# Patient Record
Sex: Female | Born: 1970 | Race: White | Hispanic: No | Marital: Single | Smoking: Never smoker
Health system: Southern US, Community
[De-identification: ages and names within clinical notes are randomized; demographics above are authoritative.]

---

## 1999-07-31 ENCOUNTER — Other Ambulatory Visit: Admission: RE | Admit: 1999-07-31 | Discharge: 1999-07-31 | Payer: Self-pay | Admitting: Obstetrics and Gynecology

## 2000-08-07 ENCOUNTER — Other Ambulatory Visit: Admission: RE | Admit: 2000-08-07 | Discharge: 2000-08-07 | Payer: Self-pay | Admitting: Obstetrics and Gynecology

## 2001-08-10 ENCOUNTER — Other Ambulatory Visit: Admission: RE | Admit: 2001-08-10 | Discharge: 2001-08-10 | Payer: Self-pay | Admitting: Obstetrics and Gynecology

## 2018-04-17 ENCOUNTER — Encounter: Payer: Self-pay | Admitting: Emergency Medicine

## 2018-04-17 ENCOUNTER — Emergency Department (INDEPENDENT_AMBULATORY_CARE_PROVIDER_SITE_OTHER)
Admission: EM | Admit: 2018-04-17 | Discharge: 2018-04-17 | Disposition: A | Discharge status: Home / Self Care | Payer: Worker's Compensation | Source: Home / Self Care | Attending: Family Medicine | Admitting: Family Medicine

## 2018-04-17 DIAGNOSIS — S7011XA Contusion of right thigh, initial encounter: Secondary | ICD-10-CM

## 2018-04-17 NOTE — ED Provider Notes (Signed)
[facility]    CSN: [id number] Arrival date & time: 04/17/18  0919     History   Chief Complaint Chief Complaint  Patient presents with  . Injury    HPI Danielle Chan is a 47 y.o. female.   While at work four days ago, patient fell through a broken floor tile.  She fell straight down approximately 18 to 20 inches landing upright on her feet.  She struck her right anterior/lateral thigh on frame edge, resulting in immediate swelling/bruising.  She subsequently also experienced mild pain in her left knee, but was able to walk without difficulty afterwards.  Over the past four days the swelling in her right thigh has gradually decreased but she has persistent mild localized tenderness.  Her left knee now has minimal discomfort, and full range of motion.  She has been taking Aleve.  The history is provided by the patient.  Leg Pain  Location:  Knee (right anterior/lateral thigh) Time since incident:  4 days Injury: yes   Knee location:  L knee Pain details:    Quality:  Aching   Radiates to:  Does not radiate   Severity:  Moderate   Onset quality:  Sudden   Duration:  4 days   Timing:  Constant   Progression:  Improving Chronicity:  New Dislocation: no   Foreign body present:  No foreign bodies Prior injury to area:  No Relieved by:  Nothing Worsened by:  Activity Associated symptoms: swelling   Associated symptoms: no back pain, no decreased ROM, no fatigue, no fever, no muscle weakness, no numbness, no stiffness and no tingling     History reviewed. No pertinent past medical history.  There are no active problems to display for this patient.   History reviewed. No pertinent surgical history.  OB History   None      Home Medications    Prior to Admission medications   Medication Sig Start Date End Date Taking? Authorizing Provider  loratadine (CLARITIN) 10 MG tablet Take 10 mg by mouth daily.   Yes [provider]  norgestrel-ethinyl  estradiol (LO/OVRAL,CRYSELLE) 0.3-30 MG-MCG tablet Take 1 tablet by mouth daily.   Yes [provider]    Family History History reviewed. No pertinent family history.  Social History Social History   Tobacco Use  . Smoking status: Never Smoker  . Smokeless tobacco: Never Used  Substance Use Topics  . Alcohol use: Yes  . Drug use: Not on file     Allergies   Patient has no allergy information on record.   Review of Systems Review of Systems  Constitutional: Negative for fatigue and fever.  HENT: Negative.   Eyes: Negative.   Respiratory: Negative.   Cardiovascular: Negative.   Gastrointestinal: Negative.   Genitourinary: Negative.   Musculoskeletal: Negative for back pain, joint swelling and stiffness.  Skin: Positive for color change.  Neurological: Negative.      Physical Exam Triage Vital Signs ED Triage Vitals [04/17/18 0949]  Enc Vitals Group     BP (!) 153/88     Pulse Rate 94     Resp      Temp 98.5 F (36.9 C)     Temp Source Oral     SpO2 97 %     Weight 210 lb (95.3 kg)     Height      Head Circumference      Peak Flow      Pain Score 6  Pain Loc      Pain Edu?      Excl. in [place]?    No data found.  Updated Vital Signs BP (!) 153/88 (BP Location: Right Arm)   Pulse 94   Temp 98.5 F (36.9 C) (Oral)   Wt 210 lb (95.3 kg)   SpO2 97%   Visual Acuity Right Eye Distance:   Left Eye Distance:   Bilateral Distance:    Right Eye Near:   Left Eye Near:    Bilateral Near:     Physical Exam  Constitutional: She appears well-developed and well-nourished. No distress.  HENT:  Head: Atraumatic.  Right Ear: External ear normal.  Left Ear: External ear normal.  Eyes: Pupils are equal, round, and reactive to light.  Neck: Normal range of motion.  Cardiovascular: Normal rate.  Pulmonary/Chest: Effort normal.  Musculoskeletal: She exhibits no edema.       Left knee: She exhibits normal range of motion, no swelling, no effusion,  no ecchymosis, no deformity, no laceration and no erythema. No tenderness found.       Right upper leg: She exhibits tenderness and swelling. She exhibits no bony tenderness, no edema, no deformity and no laceration.       Legs: Resolving hematoma right anterior/lateral thigh, mildly tender to palpation.  Right hip, knee, ankle, and foot have full range of motion.  Distal neurovascular function is intact.  No bony tenderness to palpation.  Neurological: She is alert.  Skin: Skin is warm and dry.  Nursing note and vitals reviewed.    UC Treatments / Results  Labs (all labs ordered are listed, but only abnormal results are displayed) Labs Reviewed - No data to display  EKG None  Radiology No results found.  Procedures Procedures (including critical care time)  Medications Ordered in UC Medications - No data to display  Initial Impression / Assessment and Plan / UC Course  I have reviewed the triage vital signs and the nursing notes.  Pertinent labs & imaging results that were available during my care of the patient were reviewed by me and considered in my medical decision making (see chart for details).    Ace wrap applied. Followup with Dr. Aundria Mems or Dr. Lynne Leader ([facility]) if not improving about two weeks.    Final Clinical Impressions(s) / UC Diagnoses   Final diagnoses:  Contusion of right thigh, initial encounter     Discharge Instructions     Wear ace wrap daily until swelling resolves.  May apply heating pad to right thigh 2 to 3 times daily.  Continue Aleve, 2 tabs twice daily with food.    ED Prescriptions    None        Kandra Nicolas, MD 04/23/18 1127

## 2018-04-17 NOTE — Discharge Instructions (Addendum)
Wear ace wrap daily until swelling resolves.  May apply heating pad to right thigh 2 to 3 times daily.  Continue Aleve, 2 tabs twice daily with food.

## 2018-04-17 NOTE — ED Triage Notes (Signed)
Pt states she fell at work on Tuesday. [address] thru a floor tile that was broken. Pain in left knee and right thigh. Denies LOC.

## 2018-06-07 ENCOUNTER — Emergency Department (INDEPENDENT_AMBULATORY_CARE_PROVIDER_SITE_OTHER)
Admission: EM | Admit: 2018-06-07 | Discharge: 2018-06-07 | Disposition: A | Discharge status: Home / Self Care | Payer: BLUE CROSS/BLUE SHIELD | Source: Home / Self Care

## 2018-06-07 ENCOUNTER — Other Ambulatory Visit: Payer: Self-pay

## 2018-06-07 ENCOUNTER — Encounter: Payer: Self-pay | Admitting: *Deleted

## 2018-06-07 DIAGNOSIS — B9789 Other viral agents as the cause of diseases classified elsewhere: Secondary | ICD-10-CM | POA: Diagnosis not present

## 2018-06-07 DIAGNOSIS — J069 Acute upper respiratory infection, unspecified: Secondary | ICD-10-CM

## 2018-06-07 MED ORDER — BENZONATATE 100 MG PO CAPS: 100.0000 mg | ORAL_CAPSULE | Freq: Three times a day (TID) | ORAL | 0 refills | Status: AC | PRN

## 2018-06-07 MED ORDER — AZITHROMYCIN 250 MG PO TABS: ORAL_TABLET | ORAL | 0 refills | Status: AC

## 2018-06-07 NOTE — ED Provider Notes (Signed)
[facility]    CSN: [id number] Arrival date & time: 06/07/18  0809     History   Chief Complaint Chief Complaint  Patient presents with  . Cough    HPI Danielle Chan is a 47 y.o. female.  She has had a respiratory tract infection for the last 6 days.  It started with a sore throat, then went into her head and chest.  She is continued to cough more last night.  She works at a [employer], and needs to be able to work without coughing.  She does not smoke, but lives with a father who smokes.  Does not get a lot of respiratory tract infections.  No fever.  Some phlegm.  Is gradually getting worse rather than better. HPI  History reviewed. No pertinent past medical history.  There are no active problems to display for this patient.   History reviewed. No pertinent surgical history.  OB History   None      Home Medications    Prior to Admission medications   Medication Sig Start Date End Date Taking? Authorizing Provider  loratadine (CLARITIN) 10 MG tablet Take 10 mg by mouth daily.   Yes [provider]  norgestrel-ethinyl estradiol (LO/OVRAL,CRYSELLE) 0.3-30 MG-MCG tablet Take 1 tablet by mouth daily.   Yes [provider]  omeprazole-sodium bicarbonate (ZEGERID) 40-1100 MG capsule Take 1 capsule by mouth daily before breakfast.   Yes [provider]  azithromycin (ZITHROMAX) 250 MG tablet Take 2 tabs PO x 1 dose, then 1 tab PO QD x 4 days 06/07/18   Posey Boyer, MD  benzonatate (TESSALON) 100 MG capsule Take 1-2 capsules (100-200 mg total) by mouth 3 (three) times daily as needed for cough. 06/07/18   Posey Boyer, MD    Family History Family History  Problem Relation Age of Onset  . Diabetes Mother   . Alzheimer's disease Mother   . Hypertension Mother   . Diabetes Father     Social History Social History   Tobacco Use  . Smoking status: Never Smoker  . Smokeless tobacco: Never Used  Substance Use Topics  . Alcohol  use: Yes  . Drug use: Never     Allergies   Patient has no known allergies.   Review of Systems Review of Systems No other major symptoms.  Physical Exam Triage Vital Signs ED Triage Vitals  Enc Vitals Group     BP      Pulse      Resp      Temp      Temp src      SpO2      Weight      Height      Head Circumference      Peak Flow      Pain Score      Pain Loc      Pain Edu?      Excl. in [place]?    No data found.  Updated Vital Signs BP (!) 136/104 (BP Location: Right Arm)   Pulse (!) 107   Temp 99.1 F (37.3 C) (Oral)   Resp 18   Ht 5' 8"  (1.727 m)   Wt 210 lb (95.3 kg)   LMP 06/01/2018   SpO2 97%   BMI 31.93 kg/m   Visual Acuity Right Eye Distance:   Left Eye Distance:   Bilateral Distance:    Right Eye Near:   Left Eye Near:    Bilateral Near:  Physical Exam Pleasant lady, alert and oriented.  TMs normal on the right.  Old dried blood in the left ear canal.  Neck supple without nodes.  Chest clear to auscultation.  Heart regular without murmurs.  UC Treatments / Results  Labs (all labs ordered are listed, but only abnormal results are displayed) Labs Reviewed - No data to display  EKG None  Radiology No results found.  Procedures Procedures (including critical care time)  Medications Ordered in UC Medications - No data to display  Initial Impression / Assessment and Plan / UC Course  I have reviewed the triage vital signs and the nursing notes.  Pertinent labs & imaging results that were available during my care of the patient were reviewed by me and considered in my medical decision making (see chart for details). URI with persistent cough    Explained that it will probably run its course but if not then take the antibiotic. Final Clinical Impressions(s) / UC Diagnoses   Final diagnoses:  Viral URI with cough     Discharge Instructions     Take plenty of fluids and get enough rest  Take the benzonatate cough pills 1 or  2 pills 3 times daily as needed for cough.  These are nonsedating and you can use them when you are working.  Continue taking your Claritin, but I would do so daily this week.  Take Mucinex to help thin the secretions, over-the-counter.  If the nighttime cough is too bad take Delsym at bedtime  If you are not improving in the next couple of days began the azithromycin 2 pills initially, then 1 daily for 4 days.  If you do not need it, just destroyed the prescription.    Return if necessary      ED Prescriptions    Medication Sig Dispense Auth. Provider   azithromycin (ZITHROMAX) 250 MG tablet Take 2 tabs PO x 1 dose, then 1 tab PO QD x 4 days 6 tablet Posey Boyer, MD   benzonatate (TESSALON) 100 MG capsule Take 1-2 capsules (100-200 mg total) by mouth 3 (three) times daily as needed for cough. 30 capsule Posey Boyer, MD     Controlled Substance Prescriptions  Controlled Substance Registry consulted? No   Posey Boyer, MD 06/07/18 [phone]

## 2018-06-07 NOTE — Discharge Instructions (Addendum)
Take plenty of fluids and get enough rest  Take the benzonatate cough pills 1 or 2 pills 3 times daily as needed for cough.  These are nonsedating and you can use them when you are working.  Continue taking your Claritin, but I would do so daily this week.  Take Mucinex to help thin the secretions, over-the-counter.  If the nighttime cough is too bad take Delsym at bedtime  If you are not improving in the next couple of days began the azithromycin 2 pills initially, then 1 daily for 4 days.  If you do not need it, just destroyed the prescription.    Return if necessary

## 2018-06-07 NOTE — ED Triage Notes (Signed)
Pt c/o non productive cough x 6 days. Denies fever. She has taken [employer], [employer].

## 2019-10-03 ENCOUNTER — Other Ambulatory Visit: Payer: Self-pay | Admitting: Obstetrics and Gynecology

## 2019-10-03 DIAGNOSIS — R928 Other abnormal and inconclusive findings on diagnostic imaging of breast: Secondary | ICD-10-CM

## 2019-10-26 ENCOUNTER — Ambulatory Visit
Admission: RE | Admit: 2019-10-26 | Discharge: 2019-10-26 | Disposition: A | Discharge status: Home / Self Care | Payer: Commercial Managed Care - PPO | Source: Ambulatory Visit | Attending: Obstetrics and Gynecology | Admitting: Obstetrics and Gynecology

## 2019-10-26 ENCOUNTER — Other Ambulatory Visit: Payer: Self-pay

## 2019-10-26 ENCOUNTER — Ambulatory Visit
Admission: RE | Admit: 2019-10-26 | Discharge: 2019-10-26 | Disposition: A | Discharge status: Home / Self Care | Payer: BLUE CROSS/BLUE SHIELD | Source: Ambulatory Visit | Attending: Obstetrics and Gynecology | Admitting: Obstetrics and Gynecology

## 2019-10-26 ENCOUNTER — Other Ambulatory Visit: Payer: Self-pay | Admitting: Obstetrics and Gynecology

## 2019-10-26 DIAGNOSIS — R928 Other abnormal and inconclusive findings on diagnostic imaging of breast: Secondary | ICD-10-CM

## 2020-04-30 ENCOUNTER — Other Ambulatory Visit: Payer: Self-pay

## 2020-04-30 ENCOUNTER — Other Ambulatory Visit: Payer: Self-pay | Admitting: Obstetrics and Gynecology

## 2020-04-30 ENCOUNTER — Ambulatory Visit
Admission: RE | Admit: 2020-04-30 | Discharge: 2020-04-30 | Disposition: A | Discharge status: Home / Self Care | Payer: Commercial Managed Care - PPO | Source: Ambulatory Visit | Attending: Obstetrics and Gynecology | Admitting: Obstetrics and Gynecology

## 2020-04-30 DIAGNOSIS — R928 Other abnormal and inconclusive findings on diagnostic imaging of breast: Secondary | ICD-10-CM

## 2020-10-08 ENCOUNTER — Ambulatory Visit
Admission: RE | Admit: 2020-10-08 | Discharge: 2020-10-08 | Disposition: A | Discharge status: Home / Self Care | Payer: Commercial Managed Care - PPO | Source: Ambulatory Visit | Attending: Obstetrics and Gynecology | Admitting: Obstetrics and Gynecology

## 2020-10-08 ENCOUNTER — Other Ambulatory Visit: Payer: Self-pay

## 2020-10-08 DIAGNOSIS — R928 Other abnormal and inconclusive findings on diagnostic imaging of breast: Secondary | ICD-10-CM

## 2021-09-02 ENCOUNTER — Other Ambulatory Visit: Payer: Self-pay | Admitting: Obstetrics and Gynecology

## 2021-09-02 DIAGNOSIS — R928 Other abnormal and inconclusive findings on diagnostic imaging of breast: Secondary | ICD-10-CM

## 2021-10-10 ENCOUNTER — Ambulatory Visit
Admission: RE | Admit: 2021-10-10 | Discharge: 2021-10-10 | Disposition: A | Discharge status: Home / Self Care | Payer: Commercial Managed Care - PPO | Source: Ambulatory Visit | Attending: Obstetrics and Gynecology | Admitting: Obstetrics and Gynecology

## 2021-10-10 DIAGNOSIS — R928 Other abnormal and inconclusive findings on diagnostic imaging of breast: Secondary | ICD-10-CM

## 2021-12-01 IMAGING — MG DIGITAL DIAGNOSTIC BILAT W/ TOMO W/ CAD
8 of 14 series · 8 of 40 positions shown · non-contrast
Comparison: Previous exam(s).

CLINICAL DATA: 50-year-old female for 2 year follow-up of LEFT
breast masses, and for annual bilateral mammogram.

EXAM:
DIGITAL DIAGNOSTIC BILATERAL MAMMOGRAM WITH TOMOSYNTHESIS AND CAD;
ULTRASOUND LEFT BREAST LIMITED
TECHNIQUE: Bilateral digital diagnostic mammography and breast tomosynthesis
was performed. The images were evaluated with computer-aided
detection.; Targeted ultrasound examination of the left breast was
performed.

[R CC synth-2D (1 of 4)]
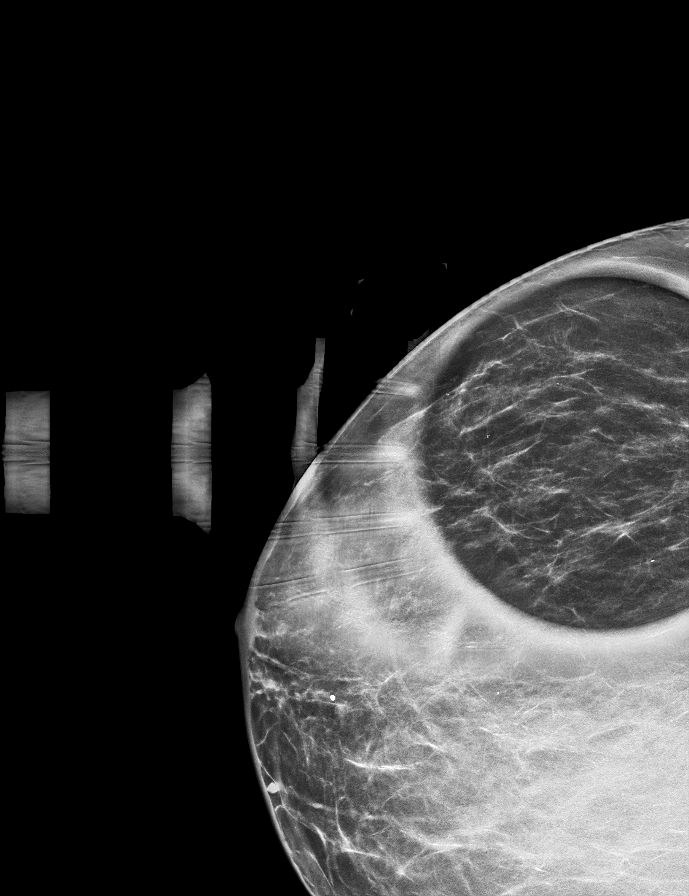

[L MLO synth-2D]
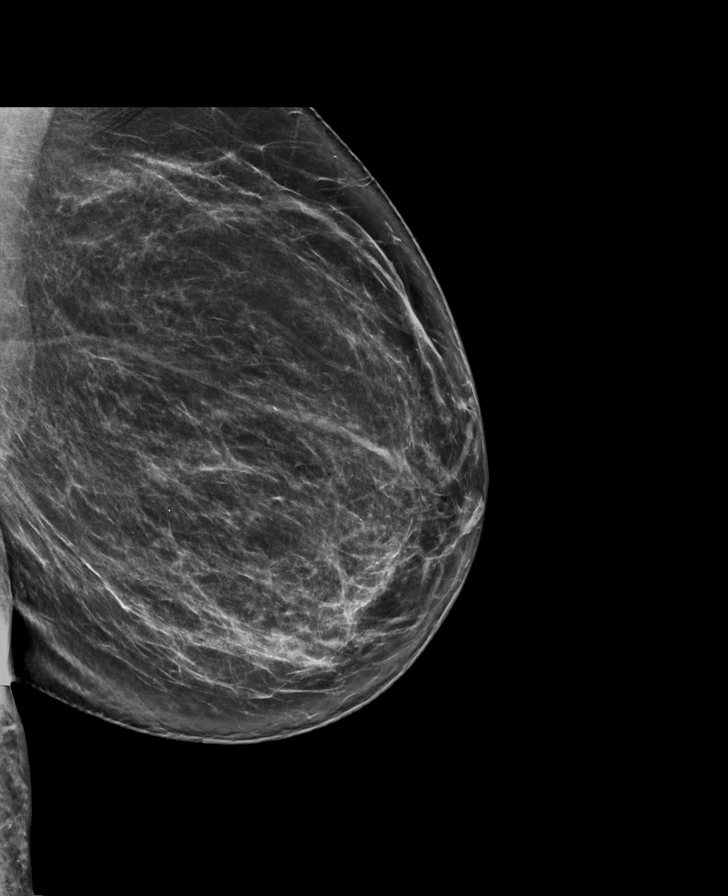

[L CC synth-2D]
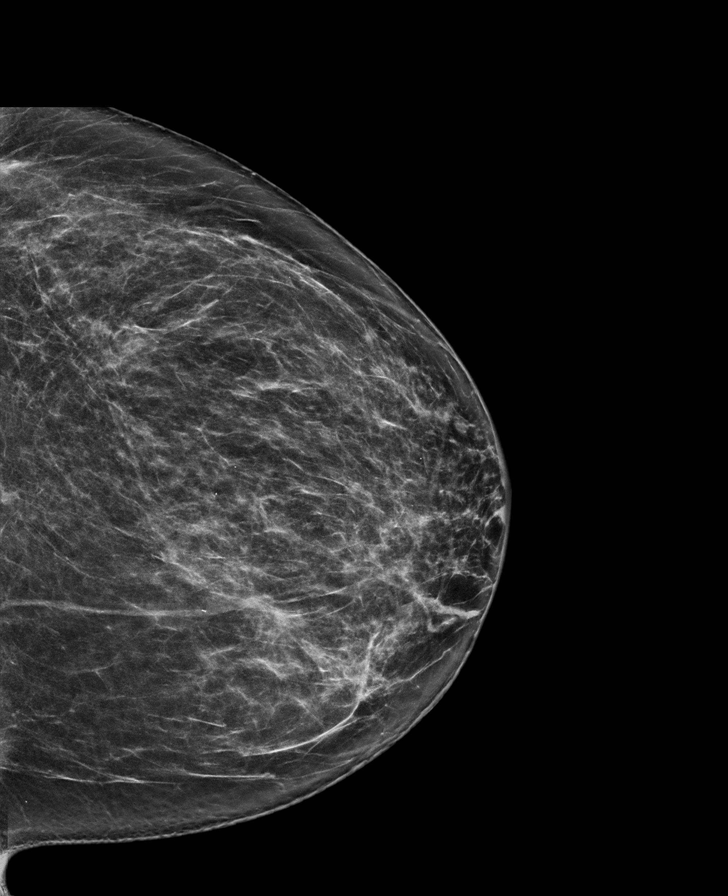

[R CC synth-2D (2 of 4)]
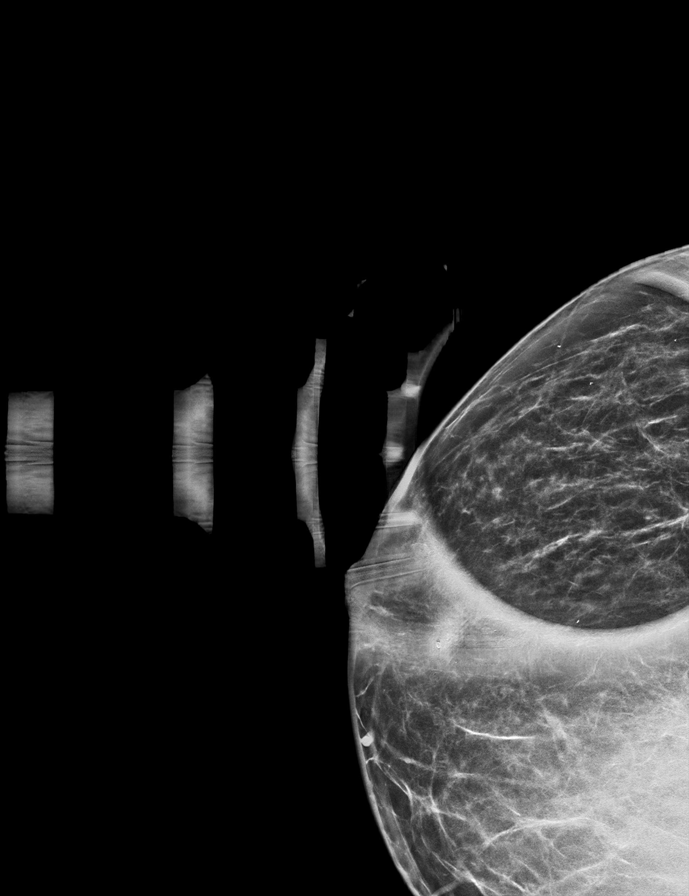

[R CC synth-2D (3 of 4)]
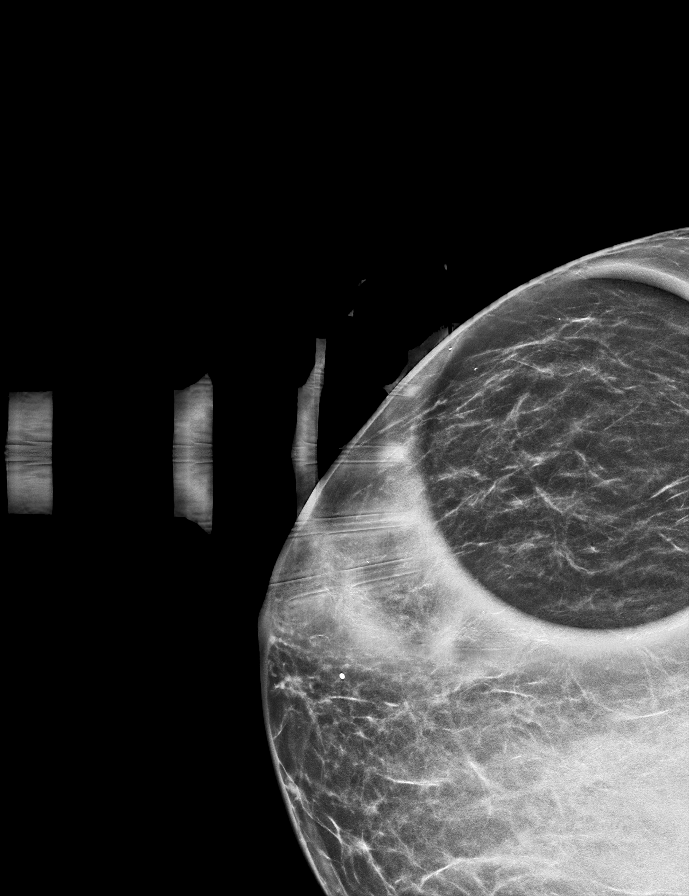

[R CC synth-2D (4 of 4)]
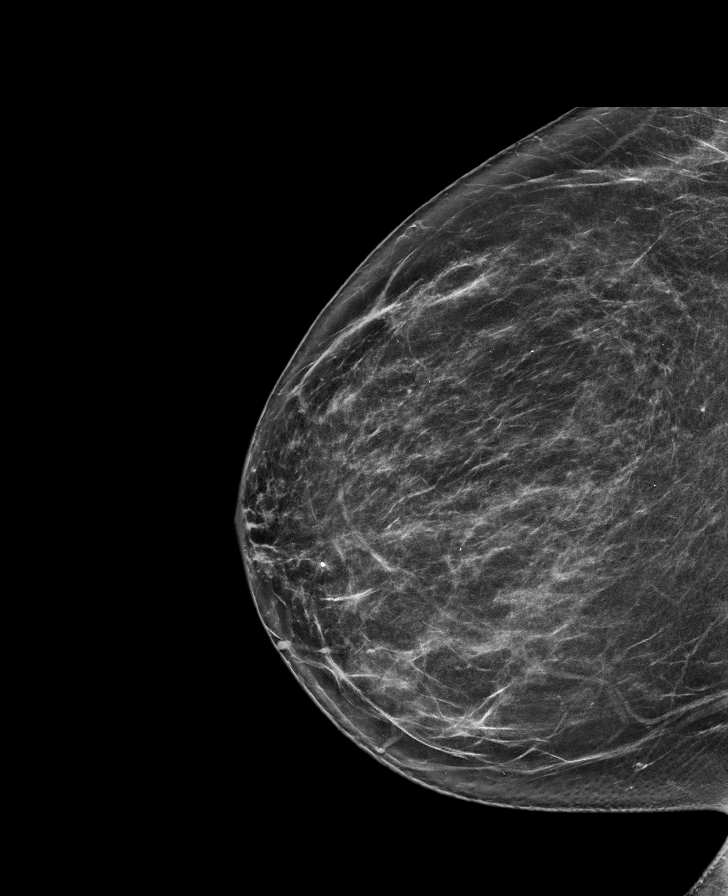

[R MLO synth-2D]
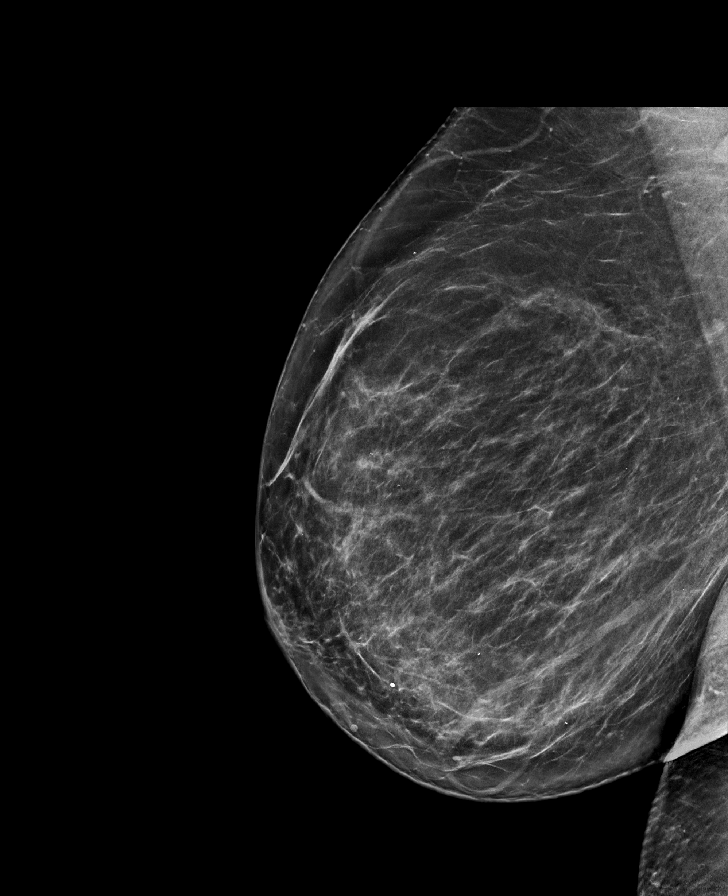

[R CC tomo · tomo slice 31/61.0]
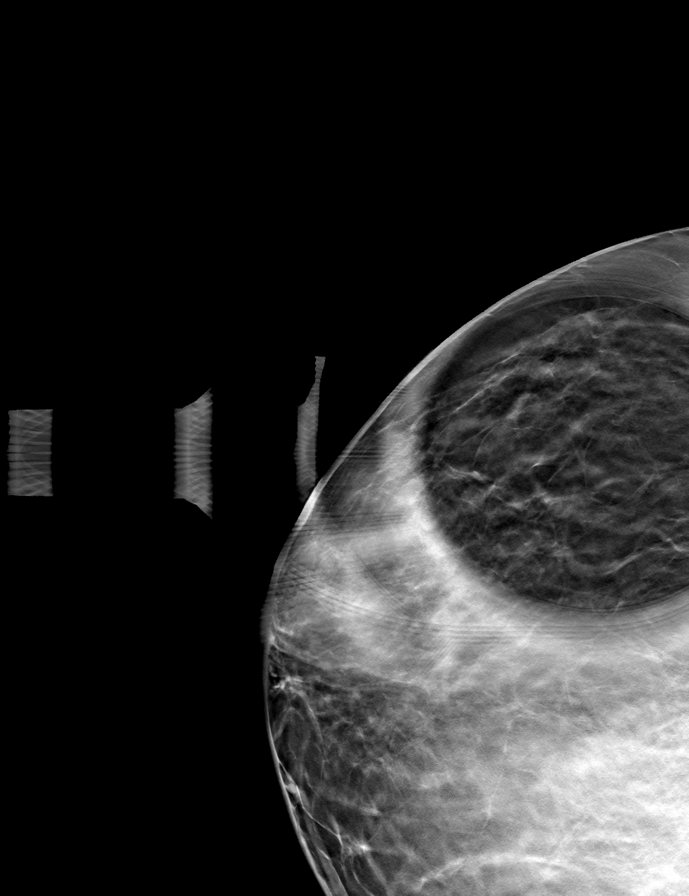

[8 of 40 positions shown; findings below may reference images not displayed]

ACR Breast Density Category b: There are scattered areas of
fibroglandular density.
FINDINGS: 2D/3D full field views of both breasts and spot compression view of
the RIGHT breast demonstrate 2 stable very small masses in the
anterior UPPER-OUTER LEFT breast.

No new or suspicious findings are noted within either breast.

Targeted ultrasound is performed, showing 2 separate 0.2 cm mass at
the 1 o'clock position of the LEFT breast 1 cm from the nipple,
stable to slightly decreased in size from 2424 and compatible with
benign etiology.
IMPRESSION: 1. Stable to slightly decreased size of 2 very small UPPER-OUTER
LEFT breast masses, considered benign and no further imaging
follow-up recommended.
2. No new or suspicious mammographic findings within either breast.

RECOMMENDATION:
Bilateral screening mammogram in 1 year.

I have discussed the findings and recommendations with the patient.
If applicable, a reminder letter will be sent to the patient
regarding the next appointment.

BI-RADS CATEGORY  2: Benign.

## 2022-06-26 LAB — COLOGUARD: COLOGUARD: POSITIVE — AB

## 2022-06-26 LAB — EXTERNAL GENERIC LAB PROCEDURE: COLOGUARD: POSITIVE — AB

## 2024-01-06 ENCOUNTER — Other Ambulatory Visit: Payer: Self-pay | Admitting: Obstetrics and Gynecology

## 2024-01-06 DIAGNOSIS — R928 Other abnormal and inconclusive findings on diagnostic imaging of breast: Secondary | ICD-10-CM

## 2024-01-21 ENCOUNTER — Ambulatory Visit
Admission: RE | Admit: 2024-01-21 | Discharge: 2024-01-21 | Disposition: A | Discharge status: Home / Self Care | Payer: Commercial Managed Care - PPO | Source: Ambulatory Visit | Attending: Obstetrics and Gynecology | Admitting: Obstetrics and Gynecology

## 2024-01-21 ENCOUNTER — Other Ambulatory Visit: Payer: Self-pay | Admitting: Obstetrics and Gynecology

## 2024-01-21 DIAGNOSIS — R928 Other abnormal and inconclusive findings on diagnostic imaging of breast: Secondary | ICD-10-CM

## 2024-01-21 DIAGNOSIS — N6310 Unspecified lump in the right breast, unspecified quadrant: Secondary | ICD-10-CM

## 2024-07-22 ENCOUNTER — Other Ambulatory Visit: Payer: Self-pay | Admitting: Obstetrics and Gynecology

## 2024-07-22 ENCOUNTER — Ambulatory Visit
Admission: RE | Admit: 2024-07-22 | Discharge: 2024-07-22 | Disposition: A | Discharge status: Home / Self Care | Source: Ambulatory Visit | Attending: Obstetrics and Gynecology | Admitting: Obstetrics and Gynecology

## 2024-07-22 DIAGNOSIS — N6310 Unspecified lump in the right breast, unspecified quadrant: Secondary | ICD-10-CM

## 2025-01-24 ENCOUNTER — Other Ambulatory Visit

## 2025-01-24 ENCOUNTER — Encounter
